# Patient Record
Sex: Male | Born: 1965 | Race: Black or African American | Hispanic: No | Marital: Single | Smoking: Never smoker
Health system: Southern US, Community
[De-identification: ages and names within clinical notes are randomized; demographics above are authoritative.]

## PROBLEM LIST (undated history)

## (undated) DIAGNOSIS — G629 Polyneuropathy, unspecified: Secondary | ICD-10-CM

## (undated) DIAGNOSIS — E119 Type 2 diabetes mellitus without complications: Secondary | ICD-10-CM

## (undated) DIAGNOSIS — M1990 Unspecified osteoarthritis, unspecified site: Secondary | ICD-10-CM

---

## 1997-10-23 ENCOUNTER — Encounter: Payer: Self-pay | Admitting: Emergency Medicine

## 1997-10-23 ENCOUNTER — Emergency Department (HOSPITAL_COMMUNITY): Admission: EM | Admit: 1997-10-23 | Discharge: 1997-10-23 | Payer: Self-pay | Admitting: Emergency Medicine

## 1997-10-24 ENCOUNTER — Ambulatory Visit (HOSPITAL_COMMUNITY): Admission: RE | Admit: 1997-10-24 | Discharge: 1997-10-24 | Payer: Self-pay | Admitting: Emergency Medicine

## 1997-12-25 ENCOUNTER — Emergency Department (HOSPITAL_COMMUNITY): Admission: EM | Admit: 1997-12-25 | Discharge: 1997-12-25 | Payer: Self-pay | Admitting: Emergency Medicine

## 1999-04-16 ENCOUNTER — Emergency Department (HOSPITAL_COMMUNITY): Admission: EM | Admit: 1999-04-16 | Discharge: 1999-04-16 | Payer: Self-pay | Admitting: Emergency Medicine

## 1999-07-14 ENCOUNTER — Emergency Department (HOSPITAL_COMMUNITY): Admission: EM | Admit: 1999-07-14 | Discharge: 1999-07-15 | Payer: Self-pay | Admitting: Emergency Medicine

## 1999-07-14 ENCOUNTER — Encounter: Payer: Self-pay | Admitting: Emergency Medicine

## 1999-08-14 ENCOUNTER — Encounter: Payer: Self-pay | Admitting: Emergency Medicine

## 1999-08-15 ENCOUNTER — Inpatient Hospital Stay (HOSPITAL_COMMUNITY): Admission: EM | Admit: 1999-08-15 | Discharge: 1999-08-15 | Payer: Self-pay | Admitting: Emergency Medicine

## 1999-09-01 ENCOUNTER — Emergency Department (HOSPITAL_COMMUNITY): Admission: EM | Admit: 1999-09-01 | Discharge: 1999-09-01 | Payer: Self-pay | Admitting: Emergency Medicine

## 1999-09-03 ENCOUNTER — Emergency Department (HOSPITAL_COMMUNITY): Admission: EM | Admit: 1999-09-03 | Discharge: 1999-09-03 | Payer: Self-pay | Admitting: Emergency Medicine

## 2000-03-15 ENCOUNTER — Emergency Department (HOSPITAL_COMMUNITY): Admission: EM | Admit: 2000-03-15 | Discharge: 2000-03-15 | Payer: Self-pay | Admitting: Emergency Medicine

## 2002-08-14 ENCOUNTER — Emergency Department (HOSPITAL_COMMUNITY): Admission: EM | Admit: 2002-08-14 | Discharge: 2002-08-14 | Payer: Self-pay | Admitting: Emergency Medicine

## 2005-01-12 ENCOUNTER — Emergency Department (HOSPITAL_COMMUNITY): Admission: EM | Admit: 2005-01-12 | Discharge: 2005-01-12 | Payer: Self-pay | Admitting: Emergency Medicine

## 2005-12-02 ENCOUNTER — Emergency Department (HOSPITAL_COMMUNITY): Admission: EM | Admit: 2005-12-02 | Discharge: 2005-12-02 | Payer: Self-pay | Admitting: Emergency Medicine

## 2006-08-10 ENCOUNTER — Emergency Department (HOSPITAL_COMMUNITY): Admission: EM | Admit: 2006-08-10 | Discharge: 2006-08-10 | Payer: Self-pay | Admitting: Emergency Medicine

## 2006-08-15 ENCOUNTER — Emergency Department (HOSPITAL_COMMUNITY): Admission: EM | Admit: 2006-08-15 | Discharge: 2006-08-15 | Payer: Self-pay | Admitting: Emergency Medicine

## 2006-09-09 ENCOUNTER — Emergency Department (HOSPITAL_COMMUNITY): Admission: EM | Admit: 2006-09-09 | Discharge: 2006-09-09 | Payer: Self-pay | Admitting: Emergency Medicine

## 2007-10-23 IMAGING — CR DG CERVICAL SPINE COMPLETE 4+V
5 series · 5 of 5 positions shown · non-contrast
Comparison: CT cervical spine, 08/10/2006

HISTORY: Neck spine pain

Exam: Cervical spine complete

[w c-spine lat]
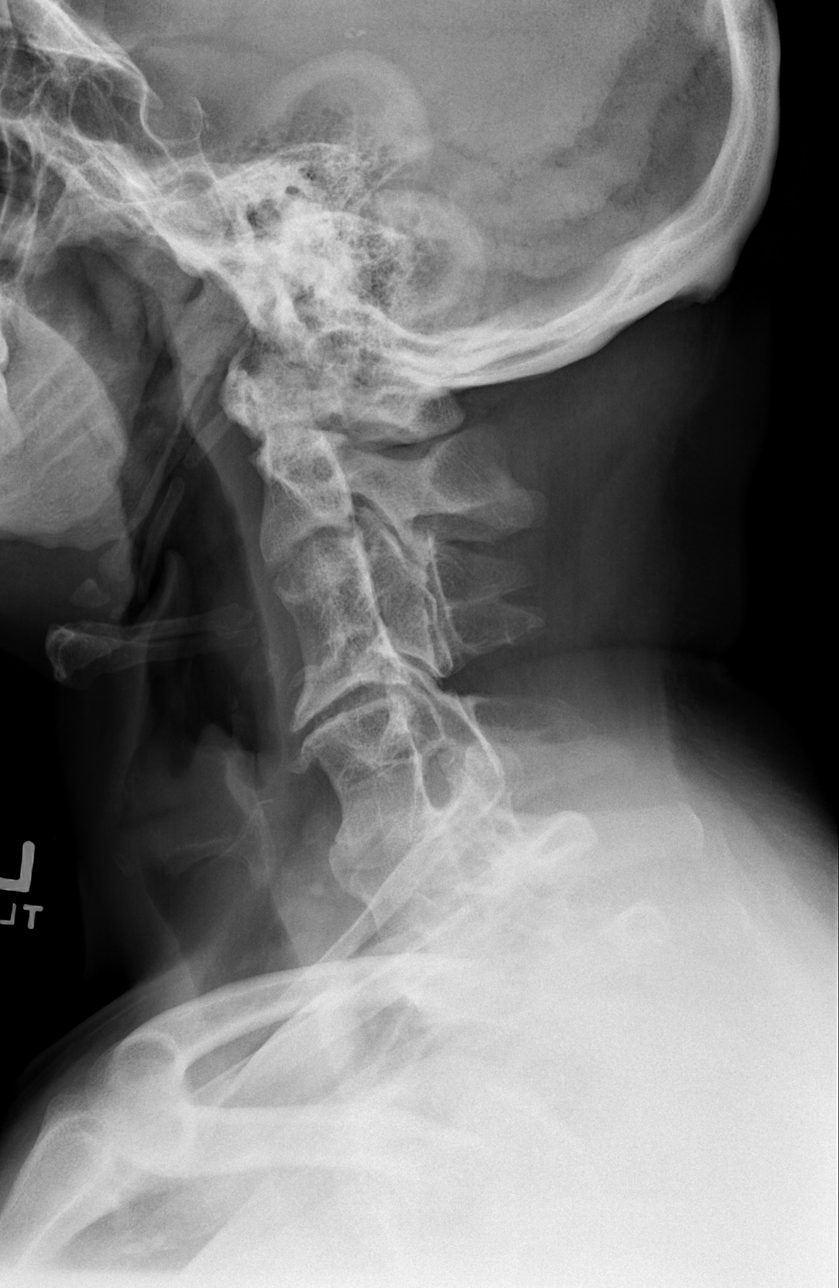

[w c-spine oblique (1 of 2)]
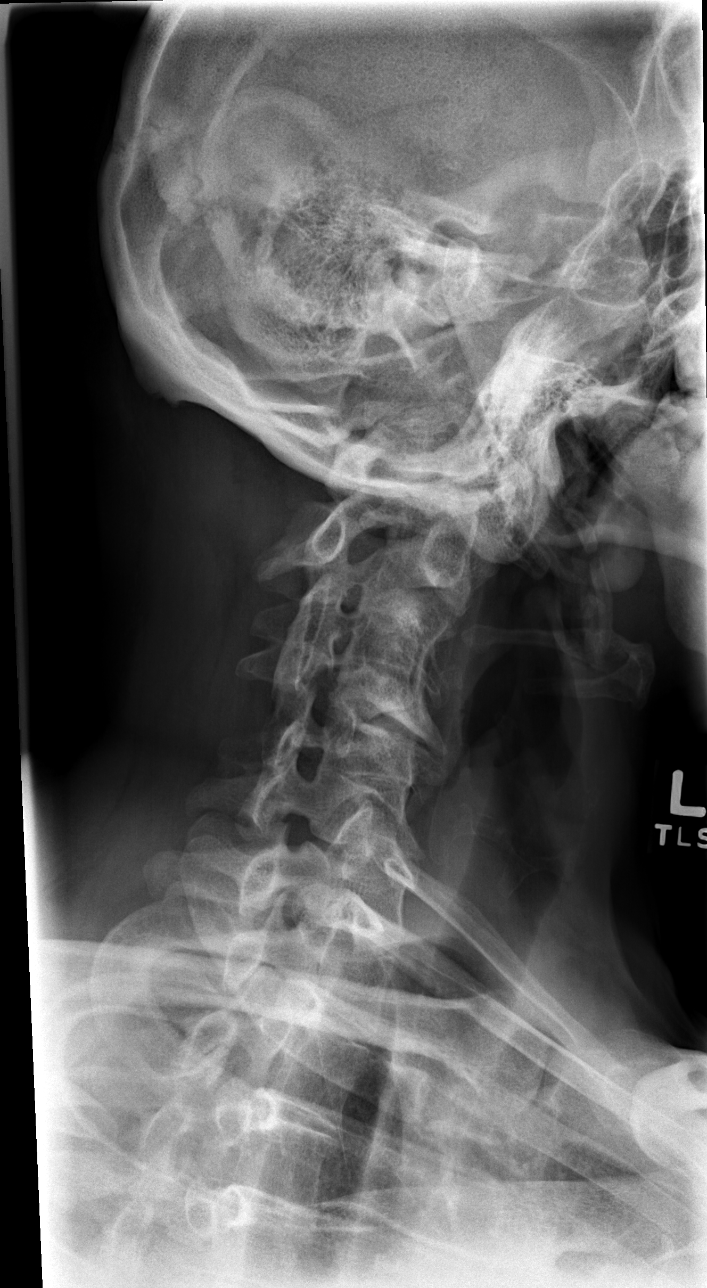

[w c-spine oblique (2 of 2)]
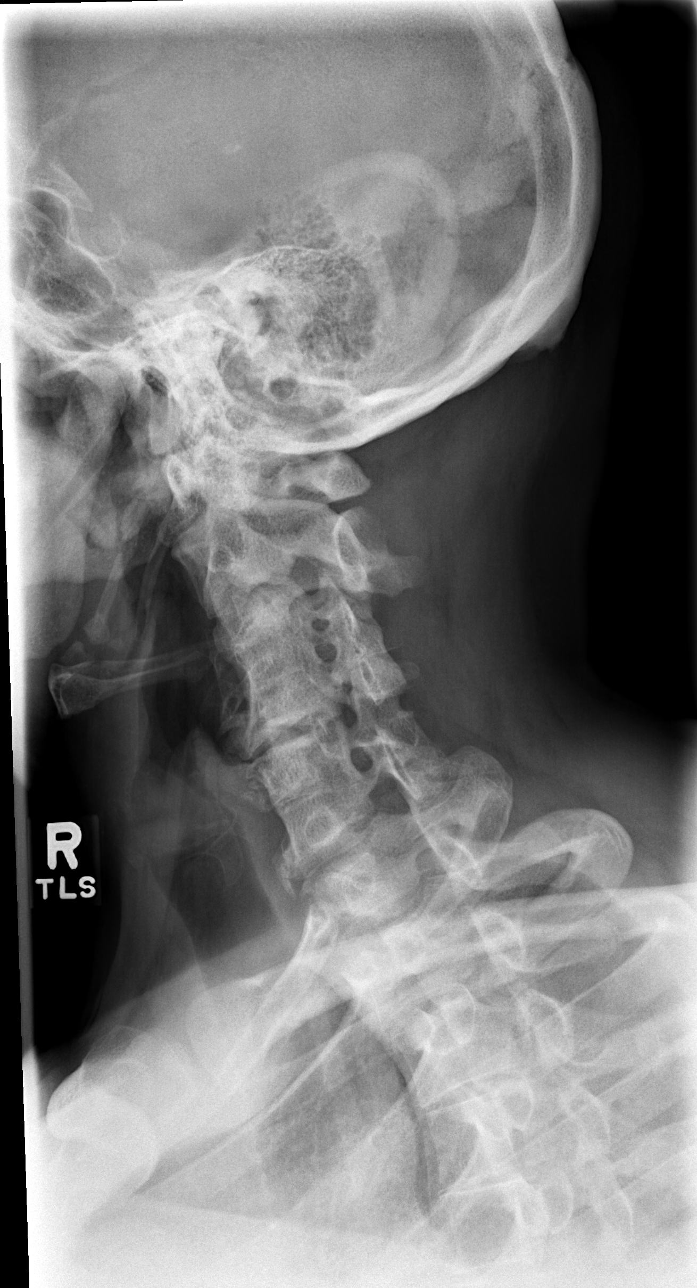

[w c-spine a.p. *]
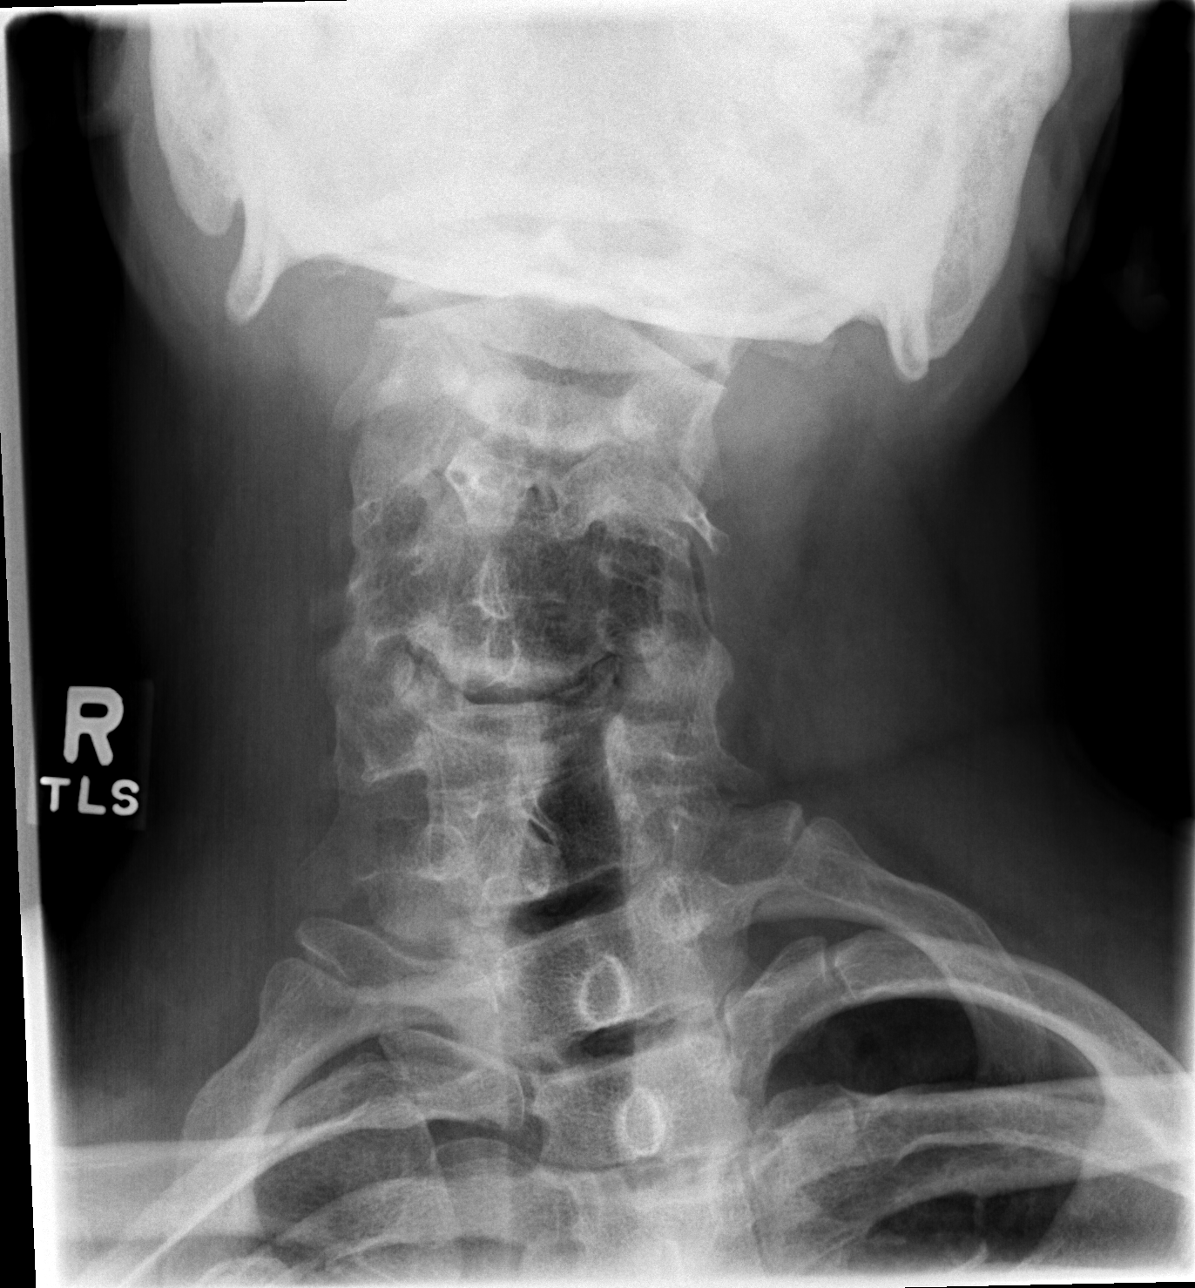

[w c-spine odontoid *]
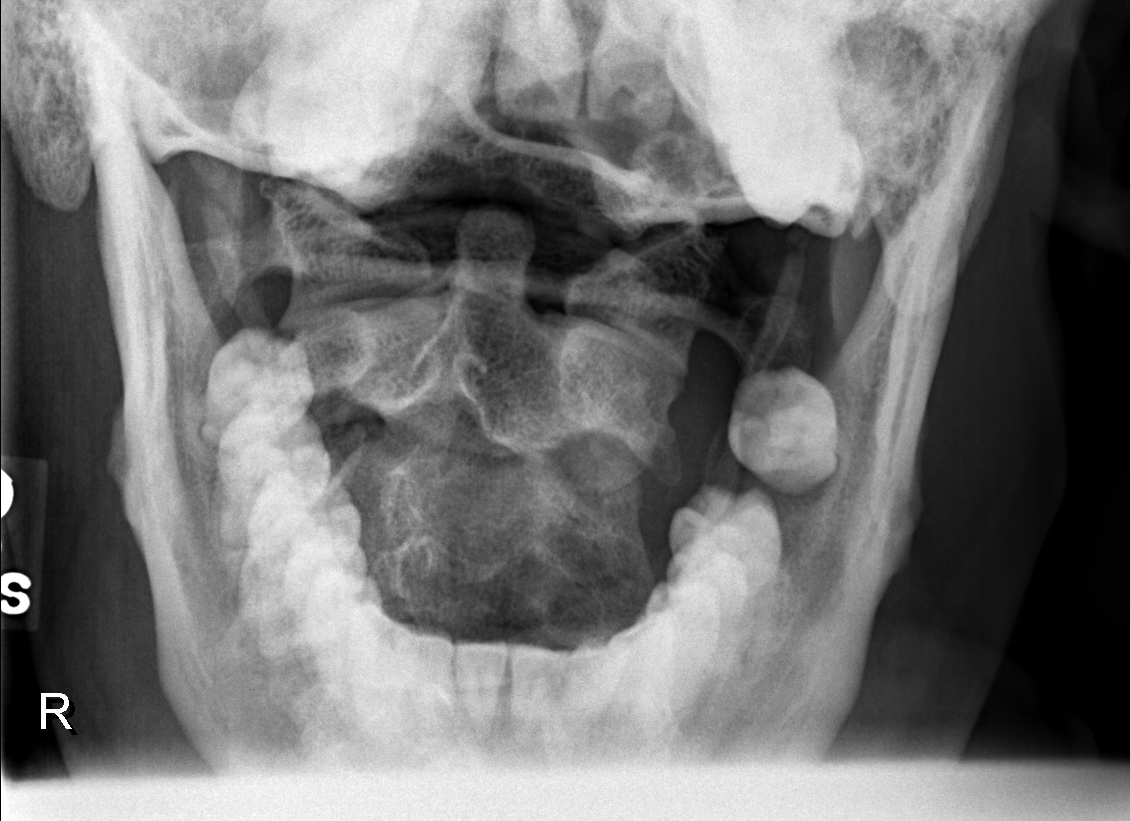

[5 of 5 positions shown; findings below may reference images not displayed]

FINDINGS: Normal alignment of the cervical spine. No prevertebral soft tissue
swelling. There is congenital block fusion of C3-C4 and C5 as well as C6 and C7
separately. Oblique views demonstrate mild foraminal narrowing at C3-C4 left.
There is an l foraminal narrowing and on the right from C3 through C5. C3-C5
levels secondary to In deformity. There is thoracolumbar cervical
scoliosis secondary to the congenital deformity.
IMPRESSION: 1.No evidence of acute fracture or subluxation.
2. No significant change from comparison CT. 
3. In deformity with vertebral body fusion and associated
neuroforaminal stenosis and scoliosis.

## 2009-01-16 ENCOUNTER — Emergency Department (HOSPITAL_COMMUNITY): Admission: EM | Admit: 2009-01-16 | Discharge: 2009-01-17 | Payer: Self-pay | Admitting: Emergency Medicine

## 2009-04-27 ENCOUNTER — Emergency Department (HOSPITAL_COMMUNITY): Admission: EM | Admit: 2009-04-27 | Discharge: 2009-04-27 | Payer: Self-pay | Admitting: Emergency Medicine

## 2009-07-28 ENCOUNTER — Emergency Department (HOSPITAL_COMMUNITY): Admission: EM | Admit: 2009-07-28 | Discharge: 2009-07-29 | Payer: Self-pay | Admitting: Emergency Medicine

## 2010-04-26 LAB — GLUCOSE, CAPILLARY
Glucose-Capillary: 334 mg/dL — ABNORMAL HIGH (ref 70–99)
Glucose-Capillary: 476 mg/dL — ABNORMAL HIGH (ref 70–99)

## 2010-04-26 LAB — BASIC METABOLIC PANEL
BUN: 11 mg/dL (ref 6–23)
Chloride: 100 mEq/L (ref 96–112)
Creatinine, Ser: 1.02 mg/dL (ref 0.4–1.5)
GFR calc non Af Amer: >60 mL/min (ref >60)

## 2010-04-26 LAB — CBC
MCV: 87.2 fL (ref 78.0–100.0)
Platelets: 228 10*3/uL (ref 150–400)
RDW: 12.6 % (ref 11.5–15.5)
WBC: 6.1 10*3/uL (ref 4.0–10.5)

## 2010-04-26 LAB — BLOOD GAS, VENOUS
Bicarbonate: 24.4 mEq/L — ABNORMAL HIGH (ref 20.0–24.0)
FIO2: 0.21 %
Patient temperature: 98.6
pH, Ven: 7.404 — ABNORMAL HIGH (ref 7.250–7.300)

## 2010-04-26 LAB — URINALYSIS, ROUTINE W REFLEX MICROSCOPIC
Glucose, UA: >1000 mg/dL — AB
Leukocytes, UA: NEGATIVE
Nitrite: NEGATIVE
Protein, ur: NEGATIVE mg/dL
Urobilinogen, UA: 0.2 mg/dL (ref 0.0–1.0)

## 2010-04-26 LAB — DIFFERENTIAL
Basophils Absolute: 0 10*3/uL (ref 0.0–0.1)
Eosinophils Absolute: 0 10*3/uL (ref 0.0–0.7)
Lymphs Abs: 1 10*3/uL (ref 0.7–4.0)
Neutrophils Relative %: 77 % (ref 43–77)

## 2010-04-26 LAB — RAPID STREP SCREEN (MED CTR MEBANE ONLY): Streptococcus, Group A Screen (Direct): NEGATIVE

## 2010-04-26 LAB — URINE MICROSCOPIC-ADD ON

## 2010-05-03 LAB — GLUCOSE, CAPILLARY: Glucose-Capillary: 241 mg/dL — ABNORMAL HIGH (ref 70–99)

## 2010-06-08 ENCOUNTER — Emergency Department (HOSPITAL_COMMUNITY)
Admission: EM | Admit: 2010-06-08 | Discharge: 2010-06-08 | Discharge status: Against Medical Advice | Payer: Self-pay | Attending: Emergency Medicine | Admitting: Emergency Medicine

## 2010-06-08 DIAGNOSIS — Z79899 Other long term (current) drug therapy: Secondary | ICD-10-CM | POA: Insufficient documentation

## 2010-06-08 DIAGNOSIS — E119 Type 2 diabetes mellitus without complications: Secondary | ICD-10-CM | POA: Insufficient documentation

## 2010-06-08 DIAGNOSIS — R631 Polydipsia: Secondary | ICD-10-CM | POA: Insufficient documentation

## 2010-06-08 DIAGNOSIS — R358 Other polyuria: Secondary | ICD-10-CM | POA: Insufficient documentation

## 2010-06-08 DIAGNOSIS — Z760 Encounter for issue of repeat prescription: Secondary | ICD-10-CM | POA: Insufficient documentation

## 2010-06-08 DIAGNOSIS — R3589 Other polyuria: Secondary | ICD-10-CM | POA: Insufficient documentation

## 2010-06-08 LAB — GLUCOSE, CAPILLARY: Glucose-Capillary: 319 mg/dL — ABNORMAL HIGH (ref 70–99)

## 2010-06-08 LAB — CBC
HCT: 41.8 % (ref 39.0–52.0)
Hemoglobin: 14.6 g/dL (ref 13.0–17.0)
RDW: 12.6 % (ref 11.5–15.5)
WBC: 11.7 10*3/uL — ABNORMAL HIGH (ref 4.0–10.5)

## 2010-06-08 LAB — DIFFERENTIAL
Basophils Absolute: 0 10*3/uL (ref 0.0–0.1)
Basophils Relative: 0 % (ref 0–1)
Lymphocytes Relative: 25 % (ref 12–46)
Neutro Abs: 8.1 10*3/uL — ABNORMAL HIGH (ref 1.7–7.7)
Neutrophils Relative %: 69 % (ref 43–77)

## 2010-06-08 LAB — BASIC METABOLIC PANEL
CO2: 27 mEq/L (ref 19–32)
Calcium: 9.2 mg/dL (ref 8.4–10.5)
GFR calc Af Amer: >60 mL/min (ref >60)
GFR calc non Af Amer: >60 mL/min (ref >60)
Glucose, Bld: 369 mg/dL — ABNORMAL HIGH (ref 70–99)
Potassium: 4.3 mEq/L (ref 3.5–5.1)
Sodium: 133 mEq/L — ABNORMAL LOW (ref 135–145)
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
## 2021-06-30 ENCOUNTER — Emergency Department (HOSPITAL_COMMUNITY)
Admission: EM | Admit: 2021-06-30 | Discharge: 2021-06-30 | Disposition: A | Discharge status: Home / Self Care | Payer: 59 | Attending: Emergency Medicine | Admitting: Emergency Medicine

## 2021-06-30 ENCOUNTER — Encounter (HOSPITAL_COMMUNITY): Payer: Self-pay | Admitting: Emergency Medicine

## 2021-06-30 DIAGNOSIS — M79642 Pain in left hand: Secondary | ICD-10-CM | POA: Insufficient documentation

## 2021-06-30 DIAGNOSIS — K0889 Other specified disorders of teeth and supporting structures: Secondary | ICD-10-CM | POA: Diagnosis not present

## 2021-06-30 DIAGNOSIS — M79672 Pain in left foot: Secondary | ICD-10-CM | POA: Insufficient documentation

## 2021-06-30 DIAGNOSIS — E119 Type 2 diabetes mellitus without complications: Secondary | ICD-10-CM | POA: Insufficient documentation

## 2021-06-30 DIAGNOSIS — M79671 Pain in right foot: Secondary | ICD-10-CM | POA: Insufficient documentation

## 2021-06-30 DIAGNOSIS — M79641 Pain in right hand: Secondary | ICD-10-CM | POA: Diagnosis not present

## 2021-06-30 DIAGNOSIS — M79673 Pain in unspecified foot: Secondary | ICD-10-CM | POA: Diagnosis present

## 2021-06-30 HISTORY — DX: Type 2 diabetes mellitus without complications: E11.9

## 2021-06-30 HISTORY — DX: Polyneuropathy, unspecified: G62.9

## 2021-06-30 HISTORY — DX: Unspecified osteoarthritis, unspecified site: M19.90

## 2021-06-30 MED ORDER — PREGABALIN 50 MG PO CAPS: 50.0000 mg | ORAL_CAPSULE | Freq: Three times a day (TID) | ORAL | 0 refills | Status: AC

## 2021-06-30 MED ORDER — PENICILLIN V POTASSIUM 500 MG PO TABS: 500.0000 mg | ORAL_TABLET | Freq: Four times a day (QID) | ORAL | 0 refills | Status: AC

## 2021-06-30 NOTE — ED Provider Triage Note (Signed)
Emergency Medicine Provider Triage Evaluation Note  John Mosley , a 56 y.o. male  was evaluated in triage.  Pt complains of pain in hands and feet for a long time.  States he has neuropathy and arthritis.  He is prescribed gabapentin, however difficulty taking this during the day as it makes him drowsy/sleepy.  He denies any new injury or trauma..  Review of Systems  Positive: Hand pain, foot pain Negative: fever  Physical Exam  BP (!) 156/98 (BP Location: Right Arm)   Pulse (!) 107   Temp 99.1 F (37.3 C) (Oral)   Resp 18   Ht 5\' 9"  (1.753 m)   Wt 70.8 kg   SpO2 100%   BMI 23.04 kg/m  Gen:   Awake, no distress   Resp:  Normal effort  MSK:   Moves extremities without difficulty  Other:    Medical Decision Making  Medically screening exam initiated at 12:49 AM.  Appropriate orders placed.  was informed that the remainder of the evaluation will be completed by another provider, this initial triage assessment does not replace that evaluation, and the importance of remaining in the ED until their evaluation is complete.  Neuropathy.  Hx of same, already on gabapentin.   Domenica Reamer, PA-C 06/30/21 [phone]

## 2021-06-30 NOTE — ED Triage Notes (Signed)
Patient c/o pain in both feet and hands "for a long time."  Patient has history of neuropathy and arthritis.

## 2021-06-30 NOTE — ED Notes (Signed)
Patient verbalizes understanding of d/c instructions. Opportunities for questions and answers were provided. Pt d/c from ED and ambulated to lobby.  

## 2021-06-30 NOTE — ED Provider Notes (Signed)
[facility] EMERGENCY DEPARTMENT Provider Note   CSN: [id number] Arrival date & time: 06/30/21  0040     History  Chief Complaint  Patient presents with   Hand Pain   Foot Pain    John Mosley is a 56 y.o. male.  56 year old male with diabetes who presents the ER today with neuropathic pain.  States gets better within the right but did not has any side effects does not want take his anymore.  Patient wants an ultrasound but he does not really know why.  No swelling in his legs.  Is been going on for quite a while.  He describes it as sharp needles in the bottom of his feet.  Also has it in his hands.  Then he also states that he has difficulty putting all his fingers together and making a fist and wants that evaluated 2.  That he also states that his tooth hurts and he needs to see a dentist but does not have an appointment.   Hand Pain  Foot Pain      Home Medications Prior to Admission medications   Medication Sig Start Date End Date Taking? Authorizing Provider  penicillin v potassium (VEETID) 500 MG tablet Take 1 tablet (500 mg total) by mouth 4 (four) times daily for 7 days. 06/30/21 07/07/21 Yes Joreen Swearingin, Barbara Cower, MD  pregabalin (LYRICA) 50 MG capsule Take 1 capsule (50 mg total) by mouth 3 (three) times daily. 06/30/21 07/30/21 Yes Ruger Saxer, Barbara Cower, MD      Allergies    Patient has no known allergies.    Review of Systems   Review of Systems  Physical Exam Updated Vital Signs BP (!) 143/86   Pulse 85   Temp 99 F (37.2 C) (Oral)   Resp 18   Ht 5\' 9"  (1.753 m)   Wt 70.8 kg   SpO2 100%   BMI 23.04 kg/m  Physical Exam Vitals and nursing note reviewed.  Constitutional:      Appearance: He is well-developed.  HENT:     Head: Normocephalic and atraumatic.     Comments: Multiple bad teeth wwithout evidence of abscess/infection.     Nose: No congestion or rhinorrhea.     Mouth/Throat:     Mouth: Mucous membranes are moist.     Pharynx: Oropharynx  is clear.  Eyes:     Pupils: Pupils are equal, round, and reactive to light.  Cardiovascular:     Rate and Rhythm: Normal rate.  Pulmonary:     Effort: Pulmonary effort is normal. No respiratory distress.  Abdominal:     General: Abdomen is flat. There is no distension.  Musculoskeletal:        General: Normal range of motion.     Cervical back: Normal range of motion.  Skin:    General: Skin is warm and dry.  Neurological:     General: No focal deficit present.     Mental Status: He is alert.     Comments: Dorsiflexion and plantarflexion intact Sensation to light touch intact in BLE    ED Results / Procedures / Treatments   Labs (all labs ordered are listed, but only abnormal results are displayed) Labs Reviewed - No data to display  EKG None  Radiology No results found.  Procedures Procedures    Medications Ordered in ED Medications - No data to display  ED Course/ Medical Decision Making/ A&P  Medical Decision Making Risk Prescription drug management.   No indication for imaging or further workup in the ED, can do that at his upcoming PCP appointment.  Will try lyrica.  PCN for possible tooth infection.   Final Clinical Impression(s) / ED Diagnoses Final diagnoses:  Pain in both feet  Tooth pain  Pain in both hands    Rx / DC Orders ED Discharge Orders          Ordered    penicillin v potassium (VEETID) 500 MG tablet  4 times daily        06/30/21 0332    pregabalin (LYRICA) 50 MG capsule  3 times daily        06/30/21 0332              Leiani Enright, Barbara Cower, MD 06/30/21 [phone]
